# Patient Record
Sex: Male | Born: 1980 | Race: White | Hispanic: No | Marital: Single | Smoking: Current every day smoker
Health system: Southern US, Community
[De-identification: ages and names within clinical notes are randomized; demographics above are authoritative.]

---

## 2006-02-01 ENCOUNTER — Emergency Department: Payer: Self-pay | Admitting: Emergency Medicine

## 2006-02-17 ENCOUNTER — Emergency Department: Payer: Self-pay | Admitting: Emergency Medicine
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
## 2013-07-25 ENCOUNTER — Encounter (HOSPITAL_COMMUNITY): Payer: Self-pay | Admitting: Emergency Medicine

## 2013-07-25 ENCOUNTER — Emergency Department (HOSPITAL_COMMUNITY): Payer: No Typology Code available for payment source

## 2013-07-25 ENCOUNTER — Emergency Department (HOSPITAL_COMMUNITY)
Admission: EM | Admit: 2013-07-25 | Discharge: 2013-07-25 | Disposition: A | Discharge status: Home / Self Care | Payer: No Typology Code available for payment source | Attending: Emergency Medicine | Admitting: Emergency Medicine

## 2013-07-25 DIAGNOSIS — S139XXA Sprain of joints and ligaments of unspecified parts of neck, initial encounter: Secondary | ICD-10-CM | POA: Insufficient documentation

## 2013-07-25 DIAGNOSIS — F17200 Nicotine dependence, unspecified, uncomplicated: Secondary | ICD-10-CM | POA: Insufficient documentation

## 2013-07-25 DIAGNOSIS — S8002XA Contusion of left knee, initial encounter: Secondary | ICD-10-CM

## 2013-07-25 DIAGNOSIS — S8000XA Contusion of unspecified knee, initial encounter: Secondary | ICD-10-CM | POA: Insufficient documentation

## 2013-07-25 DIAGNOSIS — IMO0002 Reserved for concepts with insufficient information to code with codable children: Secondary | ICD-10-CM | POA: Insufficient documentation

## 2013-07-25 DIAGNOSIS — S161XXA Strain of muscle, fascia and tendon at neck level, initial encounter: Secondary | ICD-10-CM

## 2013-07-25 DIAGNOSIS — Y92410 Unspecified street and highway as the place of occurrence of the external cause: Secondary | ICD-10-CM | POA: Insufficient documentation

## 2013-07-25 DIAGNOSIS — Y9389 Activity, other specified: Secondary | ICD-10-CM | POA: Insufficient documentation

## 2013-07-25 MED ORDER — TRAMADOL HCL 50 MG PO TABS: 50.0000 mg | ORAL_TABLET | Freq: Four times a day (QID) | ORAL | Status: DC | PRN

## 2013-07-25 MED ORDER — CYCLOBENZAPRINE HCL 10 MG PO TABS: 10.0000 mg | ORAL_TABLET | Freq: Two times a day (BID) | ORAL | Status: DC | PRN
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
## 2013-07-31 NOTE — ED Provider Notes (Signed)
Medical screening examination/treatment/procedure(s) were performed by non-physician practitioner and as supervising physician I was immediately available for consultation/collaboration.  Oluwatimilehin Balfour, MD 07/31/13 2321 

## 2013-08-06 ENCOUNTER — Emergency Department (HOSPITAL_COMMUNITY)
Admission: EM | Admit: 2013-08-06 | Discharge: 2013-08-06 | Disposition: A | Discharge status: Home / Self Care | Payer: No Typology Code available for payment source | Attending: Emergency Medicine | Admitting: Emergency Medicine

## 2013-08-06 ENCOUNTER — Encounter (HOSPITAL_COMMUNITY): Payer: Self-pay

## 2013-08-06 ENCOUNTER — Emergency Department (HOSPITAL_COMMUNITY): Payer: No Typology Code available for payment source

## 2013-08-06 DIAGNOSIS — Y9389 Activity, other specified: Secondary | ICD-10-CM | POA: Insufficient documentation

## 2013-08-06 DIAGNOSIS — Y92410 Unspecified street and highway as the place of occurrence of the external cause: Secondary | ICD-10-CM | POA: Insufficient documentation

## 2013-08-06 DIAGNOSIS — F17200 Nicotine dependence, unspecified, uncomplicated: Secondary | ICD-10-CM | POA: Insufficient documentation

## 2013-08-06 DIAGNOSIS — M62838 Other muscle spasm: Secondary | ICD-10-CM

## 2013-08-06 DIAGNOSIS — S161XXD Strain of muscle, fascia and tendon at neck level, subsequent encounter: Secondary | ICD-10-CM

## 2013-08-06 DIAGNOSIS — S139XXA Sprain of joints and ligaments of unspecified parts of neck, initial encounter: Secondary | ICD-10-CM | POA: Insufficient documentation

## 2013-08-06 DIAGNOSIS — S0993XA Unspecified injury of face, initial encounter: Secondary | ICD-10-CM | POA: Insufficient documentation

## 2013-08-06 MED ORDER — IBUPROFEN 600 MG PO TABS: 600.0000 mg | ORAL_TABLET | Freq: Three times a day (TID) | ORAL | Status: AC

## 2013-08-06 MED ORDER — IBUPROFEN 600 MG PO TABS: 600.0000 mg | ORAL_TABLET | Freq: Three times a day (TID) | ORAL | Status: DC

## 2013-08-06 NOTE — ED Notes (Signed)
Patient transported to X-ray 
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
## 2013-08-06 NOTE — ED Provider Notes (Signed)
I saw and evaluated the patient, reviewed the resident's note and I agree with the findings and plan.   .Face to face Exam:  General:  Awake HEENT:  Atraumatic Resp:  Normal effort Abd:  Nondistended Neuro:No focal weakness  Nelia Shi, MD 08/06/13 1651

## 2013-08-06 NOTE — ED Notes (Signed)
Pt not in room to receive d/c instructions.  

## 2019-11-13 ENCOUNTER — Ambulatory Visit: Payer: HRSA Program | Attending: Internal Medicine

## 2019-11-13 ENCOUNTER — Other Ambulatory Visit: Payer: Self-pay

## 2019-11-13 DIAGNOSIS — Z20828 Contact with and (suspected) exposure to other viral communicable diseases: Secondary | ICD-10-CM | POA: Diagnosis present

## 2019-11-13 DIAGNOSIS — Z20822 Contact with and (suspected) exposure to covid-19: Secondary | ICD-10-CM

## 2019-11-14 NOTE — Progress Notes (Signed)
Order(s) created erroneously. Erroneous order ID: [id number]  Order moved by: Brigitte Pulse  Order move date/time: 11/14/2019 6:49 PM  Source Patient: [id number]  Source Contact: 11/13/2019  Destination Patient: [id number]  Destination Contact: 11/13/2019

## 2019-11-14 NOTE — Progress Notes (Signed)
Moving orders to this encounter.  

## 2019-11-14 NOTE — Progress Notes (Signed)
Order(s) created erroneously. Erroneous order ID: [id number]  Order moved by: Leana Springston M  Order move date/time: 11/14/2019 6:49 PM  Source Patient: [id number]  Source Contact: 11/13/2019  Destination Patient: [id number]  Destination Contact: 11/13/2019 

## 2019-11-15 LAB — NOVEL CORONAVIRUS, NAA: SARS-CoV-2, NAA: NOT DETECTED

## 2021-10-04 NOTE — ED Notes (Signed)
 [facility] Emergency Department Attestation Note   ED Clinical Impression    Final diagnoses:  Vertigo (Primary)    ED Attending Physician Teaching Attestation    I supervised care provided by the resident. We have discussed the case, I have reviewed the note and I agree with the plan of treatment except as documented in my note.  I have personally performed a face-to-face diagnostic evaluation on this patient.    ED Attending Note    ED Triage Vitals [10/04/21 1513]  Enc Vitals Group     BP 150/100     Heart Rate 90     SpO2 Pulse      Resp 18     Temp 36.9 C (98.4 F)     Temp Source Oral     SpO2 100 %     Weight      Height      Head Circumference      Peak Flow      Pain Score      Pain Loc      Pain Edu?      Excl. in GC?     George Wilkins is a 40 y.o. male who presents to the ED for evaluation of sudden onset positional vertigo.  By time of my evaluation after meclizine, his symptoms had significantly improved but he reported earlier his symptoms were improved by lying in the bed without moving his head and worsened by rolling onto his left side.  His neurologic exam was reassuring and he had no associated tinnitus or hearing loss.  This is likely benign and peripheral.  I have low suspicion for a central etiology such as stroke or tumor.  He will be discharged for outpatient followup with primary care.   See chart and midlevel provider documentation for details.    Additional Medical Decision Making and Disclaimers   I have reviewed the vital signs and the nursing notes. Labs and radiology results that were available during my care of the patient were independently reviewed by me and considered in my medical decision making.   George Wilkins was evaluated in Emergency Department at the time of this visit for the symptoms described in the history of present illness. He was evaluated in the context of the global COVID-19 pandemic, which  necessitated consideration that the patient might be at risk for infection with the SARS-CoV-2 virus that causes COVID-19. Institutional protocols and algorithms that pertain to the evaluation of patients at risk for COVID-19 were followed during the patient's care in the ED.  Portions of this record have been created using Scientist, clinical (histocompatibility and immunogenetics). Dictation errors have been sought, but may not have been identified and corrected.

## 2024-09-11 ENCOUNTER — Ambulatory Visit
Admission: RE | Admit: 2024-09-11 | Discharge: 2024-09-11 | Disposition: A | Discharge status: Home / Self Care | Source: Ambulatory Visit

## 2024-09-11 ENCOUNTER — Ambulatory Visit (INDEPENDENT_AMBULATORY_CARE_PROVIDER_SITE_OTHER): Payer: Self-pay

## 2024-09-11 VITALS — BP 130/80 | HR 91 | Ht 73.0 in | Wt 216.0 lb

## 2024-09-11 DIAGNOSIS — R0602 Shortness of breath: Secondary | ICD-10-CM | POA: Insufficient documentation

## 2024-09-11 DIAGNOSIS — Z800 Family history of malignant neoplasm of digestive organs: Secondary | ICD-10-CM | POA: Insufficient documentation

## 2024-09-11 DIAGNOSIS — Z713 Dietary counseling and surveillance: Secondary | ICD-10-CM | POA: Diagnosis not present

## 2024-09-11 DIAGNOSIS — J4530 Mild persistent asthma, uncomplicated: Secondary | ICD-10-CM | POA: Diagnosis not present

## 2024-09-11 DIAGNOSIS — R7309 Other abnormal glucose: Secondary | ICD-10-CM | POA: Diagnosis not present

## 2024-09-11 DIAGNOSIS — R7989 Other specified abnormal findings of blood chemistry: Secondary | ICD-10-CM | POA: Diagnosis not present

## 2024-09-11 MED ORDER — ALBUTEROL SULFATE HFA 108 (90 BASE) MCG/ACT IN AERS: 2.0000 | INHALATION_SPRAY | Freq: Four times a day (QID) | RESPIRATORY_TRACT | 9 refills | Status: AC | PRN

## 2024-09-11 NOTE — Progress Notes (Signed)
 New Patient Visit   Physician: Marily Konczal A Elman Dettman, MD  Patient: George Wilkins   DOB: 12/09/1980   43 y.o. Male  MRN: [id number] Visit Date: 09/11/2024   Chief Complaint  Patient presents with   Establish Care   Subjective  George Wilkins is a 43 y.o. male who presents today as a new patient to establish care.   HPI  Discussed the use of AI scribe software for clinical note transcription with the patient, who gave verbal consent to proceed.  History of Present Illness   George Wilkins is a 43 year old male with childhood asthma who presents with shortness of breath.  Dyspnea - Shortness of breath present for the past year, consistently present but not worsening - Dyspnea is more noticeable at night and during physical exertion, - No associated chest pain, palpitations, dizziness, or lightheadedness - No cough or wheezing - No lower extremity swelling  Asthma history and inhaler use - Diagnosed with asthma at age [age], which improved over time - Recent return of breathing difficulties over the past year - Uses Primatene Mist (over-the-counter inhaler) several times daily, primarily at night - Goes through one inhaler every eight to nine days - Keeps inhaler by his bed for nighttime use as well.   - Historically, asthma uncomplicated and he had rare inhaler use  Tobacco use - No tobacco use in the past ten years - Intermittent smoking history for approximately three years in total  Diet - Low energy levels at times, attributed to diet and lifestyle - Follows a low-carbohydrate diet, primarily consuming meats and chicken - Does not eat vegetables, takes multivitamins - diet restricted to narrow food groups - Drinks water, avoids caffeine and sugars  Other medical history - Single episode of vertigo in 2022 - labs normal at that time - No history of significant medical problems other than childhood asthma and vertigo  Immunization status - Received COVID  vaccinations - Declines influenza vaccination         ASSESSMENT & PLAN  Encounter Diagnoses  Name Primary?   Shortness of breath Yes   Restricted diet    Mild persistent asthma, unspecified whether complicated     Orders Placed This Encounter  Procedures   DG Chest 2 View   CBC with Differential/Platelet   Comprehensive metabolic panel with GFR   Hemoglobin A1c   Lipid panel   Urinalysis, Routine w reflex microscopic   Brain natriuretic peptide   Iron, TIBC and Ferritin Panel   B12 and Folate Panel   TSH + free T4    Assessment and Plan    Shortness of breath Symptoms atypical for asthma exacerbation. Differential includes anemia, pulmonary, or cardiac causes. Slight wheeze noted. - Order complete blood count to assess for anemia or hematological issues. - Order chest X-ray to evaluate pulmonary causes. - Prescribe albuterol inhaler as rescue inhaler to assess response and provide relief from bronchospasm. - Instruct him to discontinue Primatene Mist and use albuterol inhaler instead. - Schedule follow-up in two weeks to review results and assess response to albuterol. - Consider EKG if initial workup is inconclusive.  General Health Maintenance Family history of colon and stomach cancer. Declined Cologuard test. Poor vegetable intake and declined flu vaccination. - Recommend colonoscopy due to family history. - Encourage multivitamins due to poor vegetable intake. - needs folate  - B12/folate/iron levels with labs   - Fu with basic labs in 2 weeks.  F/u if any  change or worsening symptoms   Objective  BP 130/80   Pulse 91   Ht 6' 1 (1.854 m)   Wt 216 lb (98 kg)   SpO2 97%   BMI 28.50 kg/m      Review of Systems  Constitutional:  Negative for chills, fever and weight loss.  Eyes:  Negative for blurred vision. h Respiratory:  Negative for cough and shortness of breath.   Cardiovascular:  Negative for chest pain and palpitations.  Skin:  Negative for  rash.  Psychiatric/Behavioral:  Negative for depression. The patient is not nervous/anxious.      Physical Exam Physical Exam Vitals reviewed.  Constitutional:      Appearance: Normal appearance. Well-developed with normal weight.  HENT:     Head: Normocephalic and atraumatic.  Normal mucous membranes, no oral lesions Eyes:     Pupils: Pupils are equal, round, and reactive to light.  Neck:     Thyroid: No thyroid mass or thyromegaly.  Cardiovascular:     Rate and Rhythm: Normal rate and regular rhythm. Normal heart sounds. Normal peripheral pulses Pulmonary:     Normal breath sounds with normal effort.  Occasional wheeze  Abdominal:   Abdomen is soft, without tenderness or noted hepatosplenomegaly Musculoskeletal:        General: No swelling or edema  Lymphadenopathy:     Cervical: No cervical adenopathy.  Skin:    General: Skin is warm and dry without noticeable rash. Neurological:     General: No focal deficit present.  Psychiatric:        Mood and Affect: Mood, behavior and cognition normal   No past medical history on file. No past surgical history on file. No family status information on file.   No family history on file. Social History   Socioeconomic History   Marital status: Single    Spouse name: Not on file   Number of children: Not on file   Years of education: Not on file   Highest education level: 12th grade  Occupational History   Not on file  Tobacco Use   Smoking status: Every Day   Smokeless tobacco: Not on file  Substance and Sexual Activity   Alcohol use: No   Drug use: Not on file   Sexual activity: Not on file  Other Topics Concern   Not on file  Social History Narrative   Not on file   Social Drivers of Health   Financial Resource Strain: Low Risk  (09/07/2024)   Overall Financial Resource Strain (CARDIA)    Difficulty of Paying Living Expenses: Not hard at all  Food Insecurity: No Food Insecurity (09/07/2024)   Hunger Vital Sign     Worried About Running Out of Food in the Last Year: Never true    Ran Out of Food in the Last Year: Never true  Transportation Needs: No Transportation Needs (09/07/2024)   PRAPARE - Administrator, Civil Service (Medical): No    Lack of Transportation (Non-Medical): No  Physical Activity: Unknown (09/07/2024)   Exercise Vital Sign    Days of Exercise per Week: 4 days    Minutes of Exercise per Session: Patient declined  Stress: No Stress Concern Present (09/07/2024)   [employer] of Occupational Health - Occupational Stress Questionnaire    Feeling of Stress: Not at all  Social Connections: Socially Isolated (09/07/2024)   Social Connection and Isolation Panel    Frequency of Communication with Friends and Family: More than three times  a week    Frequency of Social Gatherings with Friends and Family: More than three times a week    Attends Religious Services: Never    Database administrator or Organizations: No    Attends Engineer, structural: Not on file    Marital Status: Never married   Outpatient Medications Prior to Visit  Medication Sig   ibuprofen  (ADVIL ,MOTRIN ) 600 MG tablet Take 1 tablet (600 mg total) by mouth every 8 (eight) hours.   acetaminophen (TYLENOL) 500 MG tablet Take 2,500 mg by mouth every 6 (six) hours as needed for pain.   [DISCONTINUED] albuterol (PROVENTIL HFA;VENTOLIN HFA) 108 (90 BASE) MCG/ACT inhaler Inhale 2 puffs into the lungs every 6 (six) hours as needed for wheezing.   No facility-administered medications prior to visit.   No Known Allergies   There is no immunization history on file for this patient.  Health Maintenance  Topic Date Due   HIV Screening  Never done   Hepatitis C Screening  Never done   DTaP/Tdap/Td (1 - Tdap) Never done   Pneumococcal Vaccine (1 of 2 - PCV) Never done   Hepatitis B Vaccines 19-59 Average Risk (1 of 3 - 19+ 3-dose series) Never done   HPV VACCINES (1 - 3-dose SCDM series) Never  done   Influenza Vaccine  Never done   COVID-19 Vaccine (1 - 2025-26 season) Never done   Meningococcal B Vaccine  Aged Out    Patient Care Team: Patient, No Pcp Per as PCP - General (General Practice)  Depression Screen     No data to display           George DELENA Juneau, MD  [facility] [phone] (phone) [phone] (fax)  [facility]

## 2024-09-12 LAB — URINALYSIS, ROUTINE W REFLEX MICROSCOPIC
Bilirubin Urine: NEGATIVE
Glucose, UA: NEGATIVE
Hgb urine dipstick: NEGATIVE
Ketones, ur: NEGATIVE
Leukocytes,Ua: NEGATIVE
Nitrite: NEGATIVE
Protein, ur: NEGATIVE
Specific Gravity, Urine: 1.021 (ref 1.001–1.035)
pH: 6 (ref 5.0–8.0)

## 2024-09-12 LAB — CBC WITH DIFFERENTIAL/PLATELET
Absolute Lymphocytes: 1088 {cells}/uL (ref 850–3900)
Absolute Monocytes: 259 {cells}/uL (ref 200–950)
Basophils Absolute: 19 {cells}/uL (ref 0–200)
Basophils Relative: 0.5 %
Eosinophils Absolute: 118 {cells}/uL (ref 15–500)
Eosinophils Relative: 3.2 %
HCT: 46.9 % (ref 38.5–50.0)
Hemoglobin: 15.2 g/dL (ref 13.2–17.1)
MCH: 28.8 pg (ref 27.0–33.0)
MCHC: 32.4 g/dL (ref 32.0–36.0)
MCV: 88.8 fL (ref 80.0–100.0)
MPV: 10.7 fL (ref 7.5–12.5)
Monocytes Relative: 7 %
Neutro Abs: 2216 {cells}/uL (ref 1500–7800)
Neutrophils Relative %: 59.9 %
Platelets: 209 Thousand/uL (ref 140–400)
RBC: 5.28 Million/uL (ref 4.20–5.80)
RDW: 12.3 % (ref 11.0–15.0)
Total Lymphocyte: 29.4 %
WBC: 3.7 Thousand/uL — ABNORMAL LOW (ref 3.8–10.8)

## 2024-09-12 LAB — HEMOGLOBIN A1C
Hgb A1c MFr Bld: 5.1 % (ref <5.7)
Mean Plasma Glucose: 100 mg/dL
eAG (mmol/L): 5.5 mmol/L

## 2024-09-12 LAB — COMPREHENSIVE METABOLIC PANEL WITH GFR
AG Ratio: 2.3 (calc) (ref 1.0–2.5)
ALT: 20 U/L (ref 9–46)
AST: 16 U/L (ref 10–40)
Albumin: 4.9 g/dL (ref 3.6–5.1)
Alkaline phosphatase (APISO): 42 U/L (ref 36–130)
BUN/Creatinine Ratio: 34 (calc) — ABNORMAL HIGH (ref 6–22)
BUN: 32 mg/dL — ABNORMAL HIGH (ref 7–25)
CO2: 24 mmol/L (ref 20–32)
Calcium: 8.7 mg/dL (ref 8.6–10.3)
Chloride: 108 mmol/L (ref 98–110)
Creat: 0.95 mg/dL (ref 0.60–1.29)
Globulin: 2.1 g/dL (ref 1.9–3.7)
Glucose, Bld: 101 mg/dL — ABNORMAL HIGH (ref 65–99)
Potassium: 4.1 mmol/L (ref 3.5–5.3)
Sodium: 141 mmol/L (ref 135–146)
Total Bilirubin: 0.4 mg/dL (ref 0.2–1.2)
Total Protein: 7 g/dL (ref 6.1–8.1)
eGFR: 102 mL/min/1.73m2 (ref >=60)

## 2024-09-12 LAB — LIPID PANEL
Cholesterol: 184 mg/dL (ref <200)
HDL: 43 mg/dL (ref >=40)
LDL Cholesterol (Calc): 121 mg/dL — ABNORMAL HIGH
Non-HDL Cholesterol (Calc): 141 mg/dL — ABNORMAL HIGH (ref <130)
Total CHOL/HDL Ratio: 4.3 (calc) (ref <5.0)
Triglycerides: 95 mg/dL (ref <150)

## 2024-09-12 LAB — T4, FREE: Free T4: 1 ng/dL (ref 0.8–1.8)

## 2024-09-12 LAB — TSH+FREE T4: TSH W/REFLEX TO FT4: 6.18 m[IU]/L — ABNORMAL HIGH (ref 0.40–4.50)

## 2024-09-12 LAB — B12 AND FOLATE PANEL
Folate: 5.1 ng/mL — ABNORMAL LOW
Vitamin B-12: 314 pg/mL (ref 200–1100)

## 2024-09-13 ENCOUNTER — Other Ambulatory Visit (HOSPITAL_COMMUNITY): Payer: Self-pay

## 2024-09-25 ENCOUNTER — Ambulatory Visit

## 2024-09-25 VITALS — BP 118/90 | HR 97 | Ht 73.0 in | Wt 210.2 lb

## 2024-09-25 DIAGNOSIS — J4530 Mild persistent asthma, uncomplicated: Secondary | ICD-10-CM | POA: Diagnosis not present

## 2024-09-25 DIAGNOSIS — E538 Deficiency of other specified B group vitamins: Secondary | ICD-10-CM | POA: Insufficient documentation

## 2024-09-25 DIAGNOSIS — R7989 Other specified abnormal findings of blood chemistry: Secondary | ICD-10-CM | POA: Diagnosis not present

## 2024-09-25 DIAGNOSIS — R0602 Shortness of breath: Secondary | ICD-10-CM

## 2024-09-25 DIAGNOSIS — Z713 Dietary counseling and surveillance: Secondary | ICD-10-CM

## 2024-09-25 MED ORDER — BUDESONIDE-FORMOTEROL FUMARATE 160-4.5 MCG/ACT IN AERO: 2.0000 | INHALATION_SPRAY | Freq: Two times a day (BID) | RESPIRATORY_TRACT | 3 refills | Status: AC

## 2024-09-25 NOTE — Progress Notes (Signed)
 fo

## 2024-09-25 NOTE — Progress Notes (Signed)
 Progress Note  Physician: Jaymie Misch A Ashyia Schraeder, MD   HPI: George Wilkins is a 43 y.o. male presenting on 09/25/2024 for Follow-up .  Discussed the use of AI scribe software for clinical note transcription with the patient, who gave verbal consent to proceed.  History of Present Illness   George Wilkins is a 43 year old male with asthma who presents with chronic shortness of breath.  Dyspnea and asthma symptoms - Shortness of breath occurs primarily during exertion, but does not limit activity - Random episodes of exertional dyspnea - No nocturnal symptoms; no waking up gasping for air - Exacerbation by workplace - dust and chemicals - Albuterol rescue inhaler provides relief, especially when away from occupational exposures - we had started this at last visit.    Pulmonary function and prior testing - Previous pulmonary function test was normal; performed at a younger age  Thyroid function - Recent blood work showed mildly elevated TSH - No symptoms of hypothyroidism: no fatigue, constipation, or dry skin  Dietary habits and weight - Diet is primarily meat-based with limited to no vegetable intake  - Folate low on labs   Medical history:  Relevant past medical, surgical, family and social history reviewed and updated as indicated. Interim medical history since our last visit reviewed.  Allergies and medications reviewed and updated.   ROS: Negative unless specifically indicated above in HPI.    Current Outpatient Medications:    albuterol (VENTOLIN HFA) 108 (90 Base) MCG/ACT inhaler, Inhale 2 puffs into the lungs every 6 (six) hours as needed for wheezing., Disp: 1 each, Rfl: 9   ibuprofen  (ADVIL ,MOTRIN ) 600 MG tablet, Take 1 tablet (600 mg total) by mouth every 8 (eight) hours., Disp: 30 tablet, Rfl: 0   acetaminophen (TYLENOL) 500 MG tablet, Take 2,500 mg by mouth every 6 (six) hours as needed for pain., Disp: , Rfl:        Objective:     BP (!)  118/90   Pulse 97   Ht 6' 1 (1.854 m)   Wt 210 lb 3.2 oz (95.3 kg)   SpO2 95%   BMI 27.73 kg/m   Wt Readings from Last 3 Encounters:  09/25/24 210 lb 3.2 oz (95.3 kg)  09/11/24 216 lb (98 kg)  08/06/13 250 lb (113.4 kg)    Physical Exam  Physical Exam Vitals reviewed.  Constitutional:      Appearance: Normal appearance. Well-developed with normal weight.  Cardiovascular:     Rate and Rhythm: Normal rate and regular rhythm. Normal heart sounds. Normal peripheral pulses Pulmonary:     Normal breath sounds with normal effort Skin:    General: Skin is warm and dry without noticeable rash. Neurological:     General: No focal deficit present.  Psychiatric:        Mood and Affect: Mood, behavior and cognition normal      Assessment & Plan:   Encounter Diagnoses  Name Primary?   Elevated TSH Yes   Mild persistent asthma, unspecified whether complicated    Shortness of breath    Low folate    Restricted diet     No orders of the defined types were placed in this encounter.    Assessment and Plan    Asthma with occupational and allergic triggers Intermittent dyspnea with exertion, likely exacerbated by occupational dust exposure.  - Prescribe Symbicort as baseline inhaler. - Continue albuterol as rescue inhaler. - Schedule  follow-up call or visit before Christmas to assess symptom resolution. - Consider CT scan if symptoms persist.  Folate deficiency Low folate levels potentially affecting hematopoiesis, indicated by slightly low white blood cell count. Diet low in fruits and vegetables. - Ensure multivitamin contains folate.  Elevated TSH (possible subclinical hypothyroidism) Slightly elevated TSH with normal thyroid hormone levels, suggesting possible subclinical hypothyroidism. Symptoms of shortness of breath with exertion present, but may be attributable to asthma rather than hypothyroidism. - Recheck TSH levels in February.

## 2024-10-30 ENCOUNTER — Ambulatory Visit (INDEPENDENT_AMBULATORY_CARE_PROVIDER_SITE_OTHER)

## 2024-10-30 VITALS — BP 118/90 | HR 76 | Ht 73.0 in | Wt 212.8 lb

## 2024-10-30 DIAGNOSIS — R7989 Other specified abnormal findings of blood chemistry: Secondary | ICD-10-CM | POA: Diagnosis not present

## 2024-10-30 DIAGNOSIS — J4530 Mild persistent asthma, uncomplicated: Secondary | ICD-10-CM | POA: Diagnosis not present

## 2024-10-30 DIAGNOSIS — E538 Deficiency of other specified B group vitamins: Secondary | ICD-10-CM | POA: Diagnosis not present

## 2024-10-30 NOTE — Progress Notes (Signed)
 Progress Note  Physician: Elanah Osmanovic A Emarie Paul, MD   HPI: George Wilkins is a 43 y.o. male presenting on 10/30/2024 for Follow-up .  Discussed the use of AI scribe software for clinical note transcription with the patient, who gave verbal consent to proceed.  History of Present Illness   George Wilkins is a 43 year old male with likely asthma who presents for follow-up.  Respiratory symptoms and asthma management - Asthma with allergic and occupational triggers - Uses Symbicort  as maintenance inhaler and albuterol  as rescue inhaler - Significant improvement in breathing after starting symbicort  - No use of rescue inhaler recently - Attempts daily use of maintenance inhaler, occasional missed doses - Feels well overall  Laboratory findings: folate deficiency and TSH elevation to 6.18 - Low folate levels identified in October laboratory testing - No symptoms of fatigue, brain fog, or dry skin, clinically euthyroid - No fatigue  Medical history:  Relevant past medical, surgical, family and social history reviewed and updated as indicated. Interim medical history since our last visit reviewed.  Allergies and medications reviewed and updated.   ROS: Negative unless specifically indicated above in HPI.    Current Outpatient Medications:    acetaminophen (TYLENOL) 500 MG tablet, Take 2,500 mg by mouth every 6 (six) hours as needed for pain., Disp: , Rfl:    albuterol  (VENTOLIN  HFA) 108 (90 Base) MCG/ACT inhaler, Inhale 2 puffs into the lungs every 6 (six) hours as needed for wheezing., Disp: 1 each, Rfl: 9   budesonide -formoterol  (SYMBICORT ) 160-4.5 MCG/ACT inhaler, Inhale 2 puffs into the lungs 2 (two) times daily., Disp: 1 each, Rfl: 3   ibuprofen  (ADVIL ,MOTRIN ) 600 MG tablet, Take 1 tablet (600 mg total) by mouth every 8 (eight) hours., Disp: 30 tablet, Rfl: 0       Objective:     BP (!) 118/90   Pulse 76   Ht 6' 1 (1.854 m)   Wt 212 lb 12.8 oz (96.5 kg)    SpO2 96%   BMI 28.08 kg/m   Wt Readings from Last 3 Encounters:  10/30/24 212 lb 12.8 oz (96.5 kg)  09/25/24 210 lb 3.2 oz (95.3 kg)  09/11/24 216 lb (98 kg)    Physical Exam  Physical Exam Vitals reviewed.  Constitutional:      Appearance: Normal appearance. Well-developed with normal weight.  Cardiovascular:     Rate and Rhythm: Normal rate and regular rhythm. Normal heart sounds. Normal peripheral pulses Pulmonary:     Normal breath sounds with normal effort Skin:    General: Skin is warm and dry without noticeable rash. Neurological:     General: No focal deficit present.  Psychiatric:        Mood and Affect: Mood, behavior and cognition normal      Assessment & Plan:   Encounter Diagnoses  Name Primary?   Mild persistent asthma, unspecified whether complicated Yes   Elevated TSH     No orders of the defined types were placed in this encounter.    Assessment and Plan    Asthma Well-controlled with current medication. Rare albuterol  use indicates good control. - Continue Symbicort  160/4.5. - Use albuterol  as needed, increase to every 2-3 hours if symptoms occur.  Folate deficiency Previous labs showed low folate. No symptoms reported. - Recheck folate levels in April.  Is supplementing. No ETOH history, will confirm.  Reports healthy diet  Abnormal thyroid function Previous TSH slightly low.  No symptoms reported. - Recheck TSH levels in April.

## 2025-03-05 ENCOUNTER — Other Ambulatory Visit

## 2025-03-12 ENCOUNTER — Ambulatory Visit
